# Patient Record
Sex: Female | Born: 1990 | Marital: Single | Smoking: Never smoker
Health system: Southern US, Community
[De-identification: ages and names within clinical notes are randomized; demographics above are authoritative.]

## PROBLEM LIST (undated history)

## (undated) DIAGNOSIS — L732 Hidradenitis suppurativa: Secondary | ICD-10-CM

## (undated) DIAGNOSIS — R51 Headache: Secondary | ICD-10-CM

## (undated) DIAGNOSIS — R519 Headache, unspecified: Secondary | ICD-10-CM

## (undated) HISTORY — DX: Hidradenitis suppurativa: L73.2

---

## 2013-03-05 ENCOUNTER — Ambulatory Visit (INDEPENDENT_AMBULATORY_CARE_PROVIDER_SITE_OTHER): Payer: BC Managed Care – PPO | Admitting: Emergency Medicine

## 2013-03-05 VITALS — BP 142/80 | HR 83 | Temp 98.5°F | Resp 16 | Ht 67.0 in | Wt 213.0 lb

## 2013-03-05 DIAGNOSIS — Z0000 Encounter for general adult medical examination without abnormal findings: Secondary | ICD-10-CM

## 2013-03-05 MED ORDER — VALACYCLOVIR HCL 500 MG PO TABS: 500.0000 mg | ORAL_TABLET | Freq: Every day | ORAL | Status: DC

## 2013-03-05 NOTE — Progress Notes (Signed)

## 2013-03-05 NOTE — Progress Notes (Signed)
[facility] [address] [place] [address] [phone]  Date:  03/05/2013   Name:  Dacey Milberger   DOB:  Dec 15, 1990   MRN:  [id number]  PCP:  No PCP Per Patient    Chief Complaint: Annual Exam   History of Present Illness:  Margarite Bjelland is a 22 y.o. very pleasant female patient who presents with the following:  Wellness examination   There are no active problems to display for this patient.   No past medical history on file.  No past surgical history on file.  History  Substance Use Topics  . Smoking status: Never Smoker   . Smokeless tobacco: Not on file  . Alcohol Use: 0.5 oz/week    1 drink(s) per week    No family history on file.  No Known Allergies  Medication list has been reviewed and updated.  No current outpatient prescriptions on file prior to visit.   No current facility-administered medications on file prior to visit.    Review of Systems:  As per HPI, otherwise negative.    Physical Examination: Filed Vitals:   03/05/13 1821  BP: 142/80  Pulse: 83  Temp: 98.5 F (36.9 C)  Resp: 16   Filed Vitals:   03/05/13 1821  Height: 5\' 7"  (1.702 m)  Weight: 213 lb (96.616 kg)   Body mass index is 33.35 kg/(m^2). Ideal Body Weight: Weight in (lb) to have BMI = 25: 159.3  GEN: WDWN, NAD, Non-toxic, A & O x 3 HEENT: Atraumatic, Normocephalic. Neck supple. No masses, No LAD. Ears and Nose: No external deformity. CV: RRR, No M/G/R. No JVD. No thrill. No extra heart sounds. PULM: CTA B, no wheezes, crackles, rhonchi. No retractions. No resp. distress. No accessory muscle use. ABD: S, NT, ND, +BS. No rebound. No HSM. EXTR: No c/c/e NEURO Normal gait.  PSYCH: Normally interactive. Conversant. Not depressed or anxious appearing.  Calm demeanor.    Assessment and Plan: Wellness exam   Signed,  Phillips Odor, MD

## 2014-03-05 ENCOUNTER — Ambulatory Visit (INDEPENDENT_AMBULATORY_CARE_PROVIDER_SITE_OTHER): Payer: BC Managed Care – PPO | Admitting: Internal Medicine

## 2014-03-05 ENCOUNTER — Ambulatory Visit (INDEPENDENT_AMBULATORY_CARE_PROVIDER_SITE_OTHER): Payer: BC Managed Care – PPO

## 2014-03-05 VITALS — BP 122/80 | HR 76 | Temp 98.1°F | Resp 16 | Ht 66.5 in | Wt 224.0 lb

## 2014-03-05 DIAGNOSIS — S99919A Unspecified injury of unspecified ankle, initial encounter: Secondary | ICD-10-CM

## 2014-03-05 DIAGNOSIS — S8990XA Unspecified injury of unspecified lower leg, initial encounter: Secondary | ICD-10-CM

## 2014-03-05 DIAGNOSIS — S99929A Unspecified injury of unspecified foot, initial encounter: Secondary | ICD-10-CM

## 2014-03-05 DIAGNOSIS — S93409A Sprain of unspecified ligament of unspecified ankle, initial encounter: Secondary | ICD-10-CM

## 2014-03-05 DIAGNOSIS — S99911A Unspecified injury of right ankle, initial encounter: Secondary | ICD-10-CM

## 2014-03-05 MED ORDER — HYDROCODONE-ACETAMINOPHEN 5-325 MG PO TABS: 1.0000 | ORAL_TABLET | Freq: Four times a day (QID) | ORAL | Status: DC | PRN

## 2014-03-05 NOTE — Progress Notes (Signed)
   Subjective:    Patient ID: Kari Castro, female    DOB: Oct 17, 1990, 23 y.o.   MRN: [id number]  HPI Patient is being seen for right ankle injury by playing football yesterday around 7 pm. She was running with the ball and heard a pop. Pain with movement. A little numbness at the right foot and across ankle. Use socks as compress. Have not taken any OTC.  Inverted and rolled over lateral ankle with pop at lateral malleolus Previous injury the same right ankle about 13-10 years ago.    Review of Systems     Objective:   Physical Exam  Constitutional: She is oriented to person, place, and time. She appears well-developed and well-nourished. No distress.  HENT:  Head: Normocephalic.  Eyes: EOM are normal.  Neck: Normal range of motion.  Pulmonary/Chest: Effort normal.  Musculoskeletal: She exhibits edema and tenderness.       Right ankle: She exhibits decreased range of motion, swelling and ecchymosis. She exhibits no deformity, no laceration and normal pulse. Tenderness. Lateral malleolus and proximal fibula tenderness found. No medial malleolus and no head of 5th metatarsal tenderness found.       Feet:  Swollen and tender  Neurological: She is alert and oriented to person, place, and time. She has normal strength. No cranial nerve deficit or sensory deficit. She exhibits normal muscle tone. Coordination and gait abnormal.  Psychiatric: She has a normal mood and affect. Her behavior is normal. Judgment and thought content normal.   [facility] reading (PRIMARY) by  Dr.Guest no fx seen, lucency lateral malleolus with no cortical irregularity.        Assessment & Plan:  Right ankle sprain RICE/Camwalker/RICE

## 2014-03-05 NOTE — Patient Instructions (Signed)
Acute Ankle Sprain with Phase I Rehab An acute ankle sprain is a partial or complete tear in one or more of the ligaments of the ankle due to traumatic injury. The severity of the injury depends on both the number of ligaments sprained and the grade of sprain. There are 3 grades of sprains.   A grade 1 sprain is a mild sprain. There is a slight pull without obvious tearing. There is no loss of strength, and the muscle and ligament are the correct length.  A grade 2 sprain is a moderate sprain. There is tearing of fibers within the substance of the ligament where it connects two bones or two cartilages. The length of the ligament is increased, and there is usually decreased strength.  A grade 3 sprain is a complete rupture of the ligament and is uncommon. In addition to the grade of sprain, there are three types of ankle sprains.  Lateral ankle sprains: This is a sprain of one or more of the three ligaments on the outer side (lateral) of the ankle. These are the most common sprains. Medial ankle sprains: There is one large triangular ligament of the inner side (medial) of the ankle that is susceptible to injury. Medial ankle sprains are less common. Syndesmosis, "high ankle," sprains: The syndesmosis is the ligament that connects the two bones of the lower leg. Syndesmosis sprains usually only occur with very severe ankle sprains. SYMPTOMS  Pain, tenderness, and swelling in the ankle, starting at the side of injury that may progress to the whole ankle and foot with time.  "Pop" or tearing sensation at the time of injury.  Bruising that may spread to the heel.  Impaired ability to walk soon after injury. CAUSES   Acute ankle sprains are caused by trauma placed on the ankle that temporarily forces or pries the anklebone (talus) out of its normal socket.  Stretching or tearing of the ligaments that normally hold the joint in place (usually due to a twisting injury). RISK INCREASES  WITH:  Previous ankle sprain.  Sports in which the foot may land awkwardly (i.e., basketball, volleyball, or soccer) or walking or running on uneven or rough surfaces.  Shoes with inadequate support to prevent sideways motion when stress occurs.  Poor strength and flexibility.  Poor balance skills.  Contact sports. PREVENTION   Warm up and stretch properly before activity.  Maintain physical fitness:  Ankle and leg flexibility, muscle strength, and endurance.  Cardiovascular fitness.  Balance training activities.  Use proper technique and have a coach correct improper technique.  Taping, protective strapping, bracing, or high-top tennis shoes may help prevent injury. Initially, tape is best; however, it loses most of its support function within 10 to 15 minutes.  Wear proper-fitted protective shoes (High-top shoes with taping or bracing is more effective than either alone).  Provide the ankle with support during sports and practice activities for 12 months following injury. PROGNOSIS   If treated properly, ankle sprains can be expected to recover completely; however, the length of recovery depends on the degree of injury.  A grade 1 sprain usually heals enough in 5 to 7 days to allow modified activity and requires an average of 6 weeks to heal completely.  A grade 2 sprain requires 6 to 10 weeks to heal completely.  A grade 3 sprain requires 12 to 16 weeks to heal.  A syndesmosis sprain often takes more than 3 months to heal. RELATED COMPLICATIONS   Frequent recurrence of symptoms may   result in a chronic problem. Appropriately addressing the problem the first time decreases the frequency of recurrence and optimizes healing time. Severity of the initial sprain does not predict the likelihood of later instability.  Injury to other structures (bone, cartilage, or tendon).  A chronically unstable or arthritic ankle joint is a possibility with repeated  sprains. TREATMENT Treatment initially involves the use of ice, medication, and compression bandages to help reduce pain and inflammation. Ankle sprains are usually immobilized in a walking cast or boot to allow for healing. Crutches may be recommended to reduce pressure on the injury. After immobilization, strengthening and stretching exercises may be necessary to regain strength and a full range of motion. Surgery is rarely needed to treat ankle sprains. MEDICATION   Nonsteroidal anti-inflammatory medications, such as aspirin and ibuprofen (do not take for the first 3 days after injury or within 7 days before surgery), or other minor pain relievers, such as acetaminophen, are often recommended. Take these as directed by your caregiver. Contact your caregiver immediately if any bleeding, stomach upset, or signs of an allergic reaction occur from these medications.  Ointments applied to the skin may be helpful.  Pain relievers may be prescribed as necessary by your caregiver. Do not take prescription pain medication for longer than 4 to 7 days. Use only as directed and only as much as you need. HEAT AND COLD  Cold treatment (icing) is used to relieve pain and reduce inflammation for acute and chronic cases. Cold should be applied for 10 to 15 minutes every 2 to 3 hours for inflammation and pain and immediately after any activity that aggravates your symptoms. Use ice packs or an ice massage.  Heat treatment may be used before performing stretching and strengthening activities prescribed by your caregiver. Use a heat pack or a warm soak. SEEK IMMEDIATE MEDICAL CARE IF:   Pain, swelling, or bruising worsens despite treatment.  You experience pain, numbness, discoloration, or coldness in the foot or toes.  New, unexplained symptoms develop (drugs used in treatment may produce side effects.) EXERCISES  PHASE I EXERCISES RANGE OF MOTION (ROM) AND STRETCHING EXERCISES - Ankle Sprain, Acute Phase I,  Weeks 1 to 2 These exercises may help you when beginning to restore flexibility in your ankle. You will likely work on these exercises for the 1 to 2 weeks after your injury. Once your physician, physical therapist, or athletic trainer sees adequate progress, he or she will advance your exercises. While completing these exercises, remember:   Restoring tissue flexibility helps normal motion to return to the joints. This allows healthier, less painful movement and activity.  An effective stretch should be held for at least 30 seconds.  A stretch should never be painful. You should only feel a gentle lengthening or release in the stretched tissue. RANGE OF MOTION - Dorsi/Plantar Flexion  While sitting with your right / left knee straight, draw the top of your foot upwards by flexing your ankle. Then reverse the motion, pointing your toes downward.  Hold each position for __________ seconds.  After completing your first set of exercises, repeat this exercise with your knee bent. Repeat __________ times. Complete this exercise __________ times per day.  RANGE OF MOTION - Ankle Alphabet  Imagine your right / left big toe is a pen.  Keeping your hip and knee still, write out the entire alphabet with your "pen." Make the letters as large as you can without increasing any discomfort. Repeat __________ times. Complete this exercise __________   times per day.  STRENGTHENING EXERCISES - Ankle Sprain, Acute -Phase I, Weeks 1 to 2 These exercises may help you when beginning to restore strength in your ankle. You will likely work on these exercises for 1 to 2 weeks after your injury. Once your physician, physical therapist, or athletic trainer sees adequate progress, he or she will advance your exercises. While completing these exercises, remember:   Muscles can gain both the endurance and the strength needed for everyday activities through controlled exercises.  Complete these exercises as instructed by  your physician, physical therapist, or athletic trainer. Progress the resistance and repetitions only as guided.  You may experience muscle soreness or fatigue, but the pain or discomfort you are trying to eliminate should never worsen during these exercises. If this pain does worsen, stop and make certain you are following the directions exactly. If the pain is still present after adjustments, discontinue the exercise until you can discuss the trouble with your clinician. STRENGTH - Dorsiflexors  Secure a rubber exercise band/tubing to a fixed object (i.e., table, pole) and loop the other end around your right / left foot.  Sit on the floor facing the fixed object. The band/tubing should be slightly tense when your foot is relaxed.  Slowly draw your foot back toward you using your ankle and toes.  Hold this position for __________ seconds. Slowly release the tension in the band and return your foot to the starting position. Repeat __________ times. Complete this exercise __________ times per day.  STRENGTH - Plantar-flexors   Sit with your right / left leg extended. Holding onto both ends of a rubber exercise band/tubing, loop it around the ball of your foot. Keep a slight tension in the band.  Slowly push your toes away from you, pointing them downward.  Hold this position for __________ seconds. Return slowly, controlling the tension in the band/tubing. Repeat __________ times. Complete this exercise __________ times per day.  STRENGTH - Ankle Eversion  Secure one end of a rubber exercise band/tubing to a fixed object (table, pole). Loop the other end around your foot just before your toes.  Place your fists between your knees. This will focus your strengthening at your ankle.  Drawing the band/tubing across your opposite foot, slowly, pull your little toe out and up. Make sure the band/tubing is positioned to resist the entire motion.  Hold this position for __________ seconds. Have  your muscles resist the band/tubing as it slowly pulls your foot back to the starting position.  Repeat __________ times. Complete this exercise __________ times per day.  STRENGTH - Ankle Inversion  Secure one end of a rubber exercise band/tubing to a fixed object (table, pole). Loop the other end around your foot just before your toes.  Place your fists between your knees. This will focus your strengthening at your ankle.  Slowly, pull your big toe up and in, making sure the band/tubing is positioned to resist the entire motion.  Hold this position for __________ seconds.  Have your muscles resist the band/tubing as it slowly pulls your foot back to the starting position. Repeat __________ times. Complete this exercises __________ times per day.  STRENGTH - Towel Curls  Sit in a chair positioned on a non-carpeted surface.  Place your right / left foot on a towel, keeping your heel on the floor.  Pull the towel toward your heel by only curling your toes. Keep your heel on the floor.  If instructed by your physician, physical therapist,   or athletic trainer, add weight to the end of the towel. Repeat __________ times. Complete this exercise __________ times per day. Document Released: 01/03/2005 Document Revised: 10/19/2013 Document Reviewed: 09/16/2008 [facility] Patient Information 2015 [place], [place]. This information is not intended to replace advice given to you by your health care provider. Make sure you discuss any questions you have with your health care provider. RICE: Routine Care for Injuries The routine care of many injuries includes Rest, Ice, Compression, and Elevation (RICE). HOME CARE INSTRUCTIONS  Rest is needed to allow your body to heal. Routine activities can usually be resumed when comfortable. Injured tendons and bones can take up to 6 weeks to heal. Tendons are the cord-like structures that attach muscle to bone.  Ice following an injury helps keep the swelling  down and reduces pain.  Put ice in a plastic bag.  Place a towel between your skin and the bag.  Leave the ice on for 15-20 minutes, 3-4 times a day, or as directed by your health care provider. Do this while awake, for the first 24 to 48 hours. After that, continue as directed by your caregiver.  Compression helps keep swelling down. It also gives support and helps with discomfort. If an elastic bandage has been applied, it should be removed and reapplied every 3 to 4 hours. It should not be applied tightly, but firmly enough to keep swelling down. Watch fingers or toes for swelling, bluish discoloration, coldness, numbness, or excessive pain. If any of these problems occur, remove the bandage and reapply loosely. Contact your caregiver if these problems continue.  Elevation helps reduce swelling and decreases pain. With extremities, such as the arms, hands, legs, and feet, the injured area should be placed near or above the level of the heart, if possible. SEEK IMMEDIATE MEDICAL CARE IF:  You have persistent pain and swelling.  You develop redness, numbness, or unexpected weakness.  Your symptoms are getting worse rather than improving after several days. These symptoms may indicate that further evaluation or further X-rays are needed. Sometimes, X-rays may not show a small broken bone (fracture) until 1 week or 10 days later. Make a follow-up appointment with your caregiver. Ask when your X-ray results will be ready. Make sure you get your X-ray results. Document Released: 09/16/2000 Document Revised: 06/09/2013 Document Reviewed: 11/03/2010 [facility] Patient Information 2015 [place], [place]. This information is not intended to replace advice given to you by your health care provider. Make sure you discuss any questions you have with your health care provider.

## 2014-03-05 NOTE — Progress Notes (Signed)
   Subjective:    Patient ID: Kari Castro, female    DOB: [birth year], 23 y.o.   MRN: [id number]  HPI    Review of Systems     Objective:   Physical Exam        Assessment & Plan:

## 2014-03-19 ENCOUNTER — Ambulatory Visit (INDEPENDENT_AMBULATORY_CARE_PROVIDER_SITE_OTHER): Payer: BC Managed Care – PPO

## 2014-03-19 ENCOUNTER — Ambulatory Visit (INDEPENDENT_AMBULATORY_CARE_PROVIDER_SITE_OTHER): Payer: BC Managed Care – PPO | Admitting: Family Medicine

## 2014-03-19 VITALS — BP 136/82 | HR 76 | Temp 97.9°F | Resp 16 | Ht 67.0 in | Wt 213.0 lb

## 2014-03-19 DIAGNOSIS — S93401D Sprain of unspecified ligament of right ankle, subsequent encounter: Secondary | ICD-10-CM

## 2014-03-19 NOTE — Patient Instructions (Addendum)
Please wear the ankle brace while you are at your work.  If it becomes unbearable, you can put back on the camwalker boot.  If that is unbearable, please return before the two weeks.   Ibuprofen for discomfort and pain.   Return to the clinic for follow up in 2 weeks.   Listed, are exercises that you can do to strengthen the ankle and ligaments.     Acute Ankle Sprain with Phase I Rehab An acute ankle sprain is a partial or complete tear in one or more of the ligaments of the ankle due to traumatic injury. The severity of the injury depends on both the number of ligaments sprained and the grade of sprain. There are 3 grades of sprains.   A grade 1 sprain is a mild sprain. There is a slight pull without obvious tearing. There is no loss of strength, and the muscle and ligament are the correct length.  A grade 2 sprain is a moderate sprain. There is tearing of fibers within the substance of the ligament where it connects two bones or two cartilages. The length of the ligament is increased, and there is usually decreased strength.  A grade 3 sprain is a complete rupture of the ligament and is uncommon. In addition to the grade of sprain, there are three types of ankle sprains.  Lateral ankle sprains: This is a sprain of one or more of the three ligaments on the outer side (lateral) of the ankle. These are the most common sprains. Medial ankle sprains: There is one large triangular ligament of the inner side (medial) of the ankle that is susceptible to injury. Medial ankle sprains are less common. Syndesmosis, "high ankle," sprains: The syndesmosis is the ligament that connects the two bones of the lower leg. Syndesmosis sprains usually only occur with very severe ankle sprains. SYMPTOMS  Pain, tenderness, and swelling in the ankle, starting at the side of injury that may progress to the whole ankle and foot with time.  "Pop" or tearing sensation at the time of injury.  Bruising that may spread  to the heel.  Impaired ability to walk soon after injury. CAUSES   Acute ankle sprains are caused by trauma placed on the ankle that temporarily forces or pries the anklebone (talus) out of its normal socket.  Stretching or tearing of the ligaments that normally hold the joint in place (usually due to a twisting injury). RISK INCREASES WITH:  Previous ankle sprain.  Sports in which the foot may land awkwardly (i.e., basketball, volleyball, or soccer) or walking or running on uneven or rough surfaces.  Shoes with inadequate support to prevent sideways motion when stress occurs.  Poor strength and flexibility.  Poor balance skills.  Contact sports. PREVENTION   Warm up and stretch properly before activity.  Maintain physical fitness:  Ankle and leg flexibility, muscle strength, and endurance.  Cardiovascular fitness.  Balance training activities.  Use proper technique and have a coach correct improper technique.  Taping, protective strapping, bracing, or high-top tennis shoes may help prevent injury. Initially, tape is best; however, it loses most of its support function within 10 to 15 minutes.  Wear proper-fitted protective shoes (High-top shoes with taping or bracing is more effective than either alone).  Provide the ankle with support during sports and practice activities for 12 months following injury. PROGNOSIS   If treated properly, ankle sprains can be expected to recover completely; however, the length of recovery depends on the degree of injury.  A grade 1 sprain usually heals enough in 5 to 7 days to allow modified activity and requires an average of 6 weeks to heal completely.  A grade 2 sprain requires 6 to 10 weeks to heal completely.  A grade 3 sprain requires 12 to 16 weeks to heal.  A syndesmosis sprain often takes more than 3 months to heal. RELATED COMPLICATIONS   Frequent recurrence of symptoms may result in a chronic problem. Appropriately  addressing the problem the first time decreases the frequency of recurrence and optimizes healing time. Severity of the initial sprain does not predict the likelihood of later instability.  Injury to other structures (bone, cartilage, or tendon).  A chronically unstable or arthritic ankle joint is a possibility with repeated sprains. TREATMENT Treatment initially involves the use of ice, medication, and compression bandages to help reduce pain and inflammation. Ankle sprains are usually immobilized in a walking cast or boot to allow for healing. Crutches may be recommended to reduce pressure on the injury. After immobilization, strengthening and stretching exercises may be necessary to regain strength and a full range of motion. Surgery is rarely needed to treat ankle sprains. MEDICATION   Nonsteroidal anti-inflammatory medications, such as aspirin and ibuprofen (do not take for the first 3 days after injury or within 7 days before surgery), or other minor pain relievers, such as acetaminophen, are often recommended. Take these as directed by your caregiver. Contact your caregiver immediately if any bleeding, stomach upset, or signs of an allergic reaction occur from these medications.  Ointments applied to the skin may be helpful.  Pain relievers may be prescribed as necessary by your caregiver. Do not take prescription pain medication for longer than 4 to 7 days. Use only as directed and only as much as you need. HEAT AND COLD  Cold treatment (icing) is used to relieve pain and reduce inflammation for acute and chronic cases. Cold should be applied for 10 to 15 minutes every 2 to 3 hours for inflammation and pain and immediately after any activity that aggravates your symptoms. Use ice packs or an ice massage.  Heat treatment may be used before performing stretching and strengthening activities prescribed by your caregiver. Use a heat pack or a warm soak. SEEK IMMEDIATE MEDICAL CARE IF:    Pain, swelling, or bruising worsens despite treatment.  You experience pain, numbness, discoloration, or coldness in the foot or toes.  New, unexplained symptoms develop (drugs used in treatment may produce side effects.) EXERCISES  PHASE I EXERCISES RANGE OF MOTION (ROM) AND STRETCHING EXERCISES - Ankle Sprain, Acute Phase I, Weeks 1 to 2 These exercises may help you when beginning to restore flexibility in your ankle. You will likely work on these exercises for the 1 to 2 weeks after your injury. Once your physician, physical therapist, or athletic trainer sees adequate progress, he or she will advance your exercises. While completing these exercises, remember:   Restoring tissue flexibility helps normal motion to return to the joints. This allows healthier, less painful movement and activity.  An effective stretch should be held for at least 30 seconds.  A stretch should never be painful. You should only feel a gentle lengthening or release in the stretched tissue. RANGE OF MOTION - Dorsi/Plantar Flexion  While sitting with your right / left knee straight, draw the top of your foot upwards by flexing your ankle. Then reverse the motion, pointing your toes downward.  Hold each position for __________ seconds.  After completing your first  set of exercises, repeat this exercise with your knee bent. Repeat __________ times. Complete this exercise __________ times per day.  RANGE OF MOTION - Ankle Alphabet  Imagine your right / left big toe is a pen.  Keeping your hip and knee still, write out the entire alphabet with your "pen." Make the letters as large as you can without increasing any discomfort. Repeat __________ times. Complete this exercise __________ times per day.  STRENGTHENING EXERCISES - Ankle Sprain, Acute -Phase I, Weeks 1 to 2 These exercises may help you when beginning to restore strength in your ankle. You will likely work on these exercises for 1 to 2 weeks after  your injury. Once your physician, physical therapist, or athletic trainer sees adequate progress, he or she will advance your exercises. While completing these exercises, remember:   Muscles can gain both the endurance and the strength needed for everyday activities through controlled exercises.  Complete these exercises as instructed by your physician, physical therapist, or athletic trainer. Progress the resistance and repetitions only as guided.  You may experience muscle soreness or fatigue, but the pain or discomfort you are trying to eliminate should never worsen during these exercises. If this pain does worsen, stop and make certain you are following the directions exactly. If the pain is still present after adjustments, discontinue the exercise until you can discuss the trouble with your clinician. STRENGTH - Dorsiflexors  Secure a rubber exercise band/tubing to a fixed object (i.e., table, pole) and loop the other end around your right / left foot.  Sit on the floor facing the fixed object. The band/tubing should be slightly tense when your foot is relaxed.  Slowly draw your foot back toward you using your ankle and toes.  Hold this position for __________ seconds. Slowly release the tension in the band and return your foot to the starting position. Repeat __________ times. Complete this exercise __________ times per day.  STRENGTH - Plantar-flexors   Sit with your right / left leg extended. Holding onto both ends of a rubber exercise band/tubing, loop it around the ball of your foot. Keep a slight tension in the band.  Slowly push your toes away from you, pointing them downward.  Hold this position for __________ seconds. Return slowly, controlling the tension in the band/tubing. Repeat __________ times. Complete this exercise __________ times per day.  STRENGTH - Ankle Eversion  Secure one end of a rubber exercise band/tubing to a fixed object (table, pole). Loop the other end  around your foot just before your toes.  Place your fists between your knees. This will focus your strengthening at your ankle.  Drawing the band/tubing across your opposite foot, slowly, pull your little toe out and up. Make sure the band/tubing is positioned to resist the entire motion.  Hold this position for __________ seconds. Have your muscles resist the band/tubing as it slowly pulls your foot back to the starting position.  Repeat __________ times. Complete this exercise __________ times per day.  STRENGTH - Ankle Inversion  Secure one end of a rubber exercise band/tubing to a fixed object (table, pole). Loop the other end around your foot just before your toes.  Place your fists between your knees. This will focus your strengthening at your ankle.  Slowly, pull your big toe up and in, making sure the band/tubing is positioned to resist the entire motion.  Hold this position for __________ seconds.  Have your muscles resist the band/tubing as it slowly pulls your foot  back to the starting position. Repeat __________ times. Complete this exercises __________ times per day.  STRENGTH - Towel Curls  Sit in a chair positioned on a non-carpeted surface.  Place your right / left foot on a towel, keeping your heel on the floor.  Pull the towel toward your heel by only curling your toes. Keep your heel on the floor.  If instructed by your physician, physical therapist, or athletic trainer, add weight to the end of the towel. Repeat __________ times. Complete this exercise __________ times per day. Document Released: 01/03/2005 Document Revised: 10/19/2013 Document Reviewed: 09/16/2008 [facility] Patient Information 2015 [place]. This information is not intended to replace advice given to you by your health care provider. Make sure you discuss any questions you have with your health care provider.

## 2014-03-19 NOTE — Progress Notes (Signed)
   Subjective:    Patient ID: Doreene ElandHilliary Salado, female    DOB: 04/23/1991, 23 y.o.   MRN: [id number]  HPI This is a 23 year old female here today for follow up of a right ankle sprain.  2 weeks ago she [facility] after having a popping sensation when she rolled her ankle while playing football.  AnkleXR was unremarkable at that time. Today, she reports that the pain is much better.  She continues to wear the camwalker but is unable to put full weight on her right foot.  She ices the ankle every day, but no NSAID or anti-inflammatory med use.  She works as a Engineer, siteschool teacher and it has been very difficult for her to get off the ankle.      Review of Systems  Musculoskeletal: Positive for joint swelling.  Skin: Negative for color change.       Objective:   Physical Exam  Musculoskeletal:       Right ankle: She exhibits swelling. Tenderness. Lateral malleolus tenderness found.       Feet:  Foot Xray Interpretation by Dr. Neva SeatGreene: Negative      Assessment & Plan:  Sprain of ankle, right, subsequent encounter - Plan: DG Ankle Complete Right The foot xray still does not demonstrate fractures or dislocations.   Patient given a brace with instruction of therapeutic exercises to strengthen her ankle. RTC in 2 weeks.  Trena PlattStephanie English, PA-C [facility] 10/2/20155:57 PM

## 2014-03-20 NOTE — Progress Notes (Signed)
I have examined this patient along with Ms. English, PA-C and agree.  

## 2014-10-28 ENCOUNTER — Other Ambulatory Visit: Payer: Self-pay | Admitting: Emergency Medicine

## 2015-03-05 ENCOUNTER — Ambulatory Visit (INDEPENDENT_AMBULATORY_CARE_PROVIDER_SITE_OTHER): Payer: BC Managed Care – PPO | Admitting: Physician Assistant

## 2015-03-05 VITALS — BP 120/70 | HR 73 | Temp 99.0°F | Resp 14 | Ht 67.5 in | Wt 221.0 lb

## 2015-03-05 DIAGNOSIS — R599 Enlarged lymph nodes, unspecified: Secondary | ICD-10-CM

## 2015-03-05 DIAGNOSIS — R591 Generalized enlarged lymph nodes: Secondary | ICD-10-CM | POA: Diagnosis not present

## 2015-03-05 DIAGNOSIS — H9202 Otalgia, left ear: Secondary | ICD-10-CM

## 2015-03-05 DIAGNOSIS — M62838 Other muscle spasm: Secondary | ICD-10-CM

## 2015-03-05 DIAGNOSIS — M6248 Contracture of muscle, other site: Secondary | ICD-10-CM | POA: Diagnosis not present

## 2015-03-05 MED ORDER — CYCLOBENZAPRINE HCL 5 MG PO TABS: 5.0000 mg | ORAL_TABLET | Freq: Three times a day (TID) | ORAL | Status: DC | PRN

## 2015-03-05 MED ORDER — MELOXICAM 7.5 MG PO TABS: 7.5000 mg | ORAL_TABLET | Freq: Every day | ORAL | Status: DC

## 2015-03-05 NOTE — Patient Instructions (Signed)
Please take the flexeril up to three times daily for the muscle spasm and mobic once daily.  Applying heat to the area will help along with massage. Your swollen lymph nodes are not concerning at this time but please come back to see [place] if they're not decreasing in size in 10-14 days.   Swollen Lymph Nodes The lymphatic system filters fluid from around cells. It is like a system of blood vessels. These channels carry lymph instead of blood. The lymphatic system is an important part of the immune (disease fighting) system. When people talk about "swollen glands in the neck," they are usually talking about swollen lymph nodes. The lymph nodes are like the little traps for infection. You and your caregiver may be able to feel lymph nodes, especially swollen nodes, in these common areas: the groin (inguinal area), armpits (axilla), and above the clavicle (supraclavicular). You may also feel them in the neck (cervical) and the back of the head just above the hairline (occipital). Swollen glands occur when there is any condition in which the body responds with an allergic type of reaction. For instance, the glands in the neck can become swollen from insect bites or any type of minor infection on the head. These are very noticeable in children with only minor problems. Lymph nodes may also become swollen when there is a tumor or problem with the lymphatic system, such as Hodgkin's disease. TREATMENT   Most swollen glands do not require treatment. They can be observed (watched) for a short period of time, if your caregiver feels it is necessary. Most of the time, observation is not necessary.  Antibiotics (medicines that kill germs) may be prescribed by your caregiver. Your caregiver may prescribe these if he or she feels the swollen glands are due to a bacterial (germ) infection. Antibiotics are not used if the swollen glands are caused by a virus. HOME CARE INSTRUCTIONS   Take medications as directed by your  caregiver. Only take over-the-counter or prescription medicines for pain, discomfort, or fever as directed by your caregiver. SEEK MEDICAL CARE IF:   If you begin to run a temperature greater than 102 F (38.9 C), or as your caregiver suggests. MAKE SURE YOU:   Understand these instructions.  Will watch your condition.  Will get help right away if you are not doing well or get worse. Document Released: 05/25/2002 Document Revised: 08/27/2011 Document Reviewed: 06/04/2005 [facility] Patient Information 2015 [place], [place]. This information is not intended to replace advice given to you by your health care provider. Make sure you discuss any questions you have with your health care provider.   Muscle Cramps and Spasms Muscle cramps and spasms occur when a muscle or muscles tighten and you have no control over this tightening (involuntary muscle contraction). They are a common problem and can develop in any muscle. The most common place is in the calf muscles of the leg. Both muscle cramps and muscle spasms are involuntary muscle contractions, but they also have differences:   Muscle cramps are sporadic and painful. They may last a few seconds to a quarter of an hour. Muscle cramps are often more forceful and last longer than muscle spasms.  Muscle spasms may or may not be painful. They may also last just a few seconds or much longer. CAUSES  It is uncommon for cramps or spasms to be due to a serious underlying problem. In many cases, the cause of cramps or spasms is unknown. Some common causes are:  Overexertion.   Overuse from repetitive motions (doing the same thing over and over).   Remaining in a certain position for a long period of time.   Improper preparation, form, or technique while performing a sport or activity.   Dehydration.   Injury.   Side effects of some medicines.   Abnormally low levels of the salts and ions in your blood (electrolytes), especially  potassium and calcium. This could happen if you are taking water pills (diuretics) or you are pregnant.  Some underlying medical problems can make it more likely to develop cramps or spasms. These include, but are not limited to:   Diabetes.   Parkinson disease.   Hormone disorders, such as thyroid problems.   Alcohol abuse.   Diseases specific to muscles, joints, and bones.   Blood vessel disease where not enough blood is getting to the muscles.  HOME CARE INSTRUCTIONS   Stay well hydrated. Drink enough water and fluids to keep your urine clear or pale yellow.  It may be helpful to massage, stretch, and relax the affected muscle.  For tight or tense muscles, use a warm towel, heating pad, or hot shower water directed to the affected area.  If you are sore or have pain after a cramp or spasm, applying ice to the affected area may relieve discomfort.  Put ice in a plastic bag.  Place a towel between your skin and the bag.  Leave the ice on for 15-20 minutes, 03-04 times a day.  Medicines used to treat a known cause of cramps or spasms may help reduce their frequency or severity. Only take over-the-counter or prescription medicines as directed by your caregiver. SEEK MEDICAL CARE IF:  Your cramps or spasms get more severe, more frequent, or do not improve over time.  MAKE SURE YOU:   Understand these instructions.  Will watch your condition.  Will get help right away if you are not doing well or get worse. Document Released: 11/24/2001 Document Revised: 09/29/2012 Document Reviewed: 05/21/2012 [facility] Patient Information 2015 [place], [place]. This information is not intended to replace advice given to you by your health care provider. Make sure you discuss any questions you have with your health care provider.

## 2015-03-05 NOTE — Progress Notes (Signed)
   Subjective:    Patient ID: Kari Castro, female    DOB: 1991/02/26, 24 y.o.   MRN: [id number]  Chief Complaint  Patient presents with  . Neck Pain    c/o left sided neck pain x's 3 mos  . Ear Pain    Left ear pain x's 3 mos   Medications, allergies, past medical history, surgical history, family history, social history and problem list reviewed and updated.  HPI  24 yof presents with above sx.  Sx started 3 months ago with left sided neck pain. Especially with turning head to left. No trauma. Resolved after one month. Approx 1-2 weeks ago pain returned. Again no trauma. Also having mild right sided neck pain. Mostly when turns head.   Also mentions left ear hurting past 1-2 weeks. Feels like in ear. Denies discharge. Denies fevers or hearing loss. No tinnitus.   Review of Systems See HPI. No cough, st, right otalgia.     Objective:   Physical Exam  Constitutional: She appears well-developed and well-nourished.  Non-toxic appearance. She does not have a sickly appearance. She does not appear ill. No distress.  BP 120/70 mmHg  Pulse 73  Temp(Src) 99 F (37.2 C) (Oral)  Resp 14  Ht 5' 7.5" (1.715 m)  Wt 221 lb (100.245 kg)  BMI 34.08 kg/m2  SpO2 97%  LMP 02/27/2015   HENT:  Right Ear: Tympanic membrane normal. No drainage or tenderness.  Left Ear: Tympanic membrane normal. No drainage or tenderness.  Nose: No mucosal edema or rhinorrhea. Right sinus exhibits no maxillary sinus tenderness and no frontal sinus tenderness. Left sinus exhibits no maxillary sinus tenderness and no frontal sinus tenderness.  Mouth/Throat: Uvula is midline, oropharynx is clear and moist and mucous membranes are normal.  No mastoid tenderness bilaterally.   Neck: Normal range of motion. Muscular tenderness present. No spinous process tenderness present. No Brudzinski's sign noted.    Bilateral cervical muscle spasms.   Lymphadenopathy:       Head (right side): Submandibular and tonsillar  adenopathy present. No submental adenopathy present.       Head (left side): No submental, no submandibular and no tonsillar adenopathy present.    She has no cervical adenopathy.      Assessment & Plan:   Muscle spasms of neck - Plan: cyclobenzaprine (FLEXERIL) 5 MG tablet, meloxicam (MOBIC) 7.5 MG tablet Otalgia, left Swollen lymph nodes --spasms along neck likely causing pain -- flexeril, mobic, heat, massage, light range of motion --normal vitals, neg brudzinskis, left ear exam normal, no mastoid ttp --right submandibular/tonsillar LAN - likely benign process as exam benign today, normal vitals, instructed to rtc 10-14 days if LNs not decreasing in size, pt agreeable  Donnajean Lopes, PA-C Physician Assistant-Certified [facility]  03/05/2015 9:38 AM

## 2015-03-08 ENCOUNTER — Telehealth: Payer: Self-pay

## 2015-03-08 NOTE — Telephone Encounter (Signed)
The muscle relaxer is not knocking out the pain.  Does she need to come back in?  [phone]

## 2015-03-09 ENCOUNTER — Ambulatory Visit (INDEPENDENT_AMBULATORY_CARE_PROVIDER_SITE_OTHER): Payer: BC Managed Care – PPO | Admitting: Emergency Medicine

## 2015-03-09 VITALS — BP 122/72 | HR 87 | Temp 98.4°F | Resp 17 | Ht 67.5 in | Wt 222.0 lb

## 2015-03-09 DIAGNOSIS — S161XXD Strain of muscle, fascia and tendon at neck level, subsequent encounter: Secondary | ICD-10-CM | POA: Diagnosis not present

## 2015-03-09 MED ORDER — NAPROXEN SODIUM 550 MG PO TABS: 550.0000 mg | ORAL_TABLET | Freq: Two times a day (BID) | ORAL | Status: DC

## 2015-03-09 MED ORDER — HYDROCODONE-ACETAMINOPHEN 5-325 MG PO TABS: 1.0000 | ORAL_TABLET | ORAL | Status: DC | PRN

## 2015-03-09 NOTE — Patient Instructions (Signed)

## 2015-03-09 NOTE — Telephone Encounter (Signed)
Yes she needs to be rechecked.

## 2015-03-09 NOTE — Progress Notes (Signed)
Subjective:  Patient ID: Kari Castro, female    DOB: Nov 22, 1990  Age: 24 y.o. MRN: [id number]  CC: Neck Pain and Follow-up   HPI Kari Castro presents  for follow-up of a neck injury. She's been treated with mobic and Flexeril with no improvement. She has no discrete injury or overuse. She has no radiation of pain numbness tingling or weakness. She was last seen over the weekend. The pain is worse when she is physically active and goes away when she lays down flat.  History Kari Castro has no past medical history on file.   She has no past surgical history on file.   Her  family history includes Diabetes in her mother; Hypertension in her mother.  She   reports that she has never smoked. She does not have any smokeless tobacco history on file. She reports that she drinks about 0.5 oz of alcohol per week. She reports that she does not use illicit drugs.  Outpatient Prescriptions Prior to Visit  Medication Sig Dispense Refill  . cyclobenzaprine (FLEXERIL) 5 MG tablet Take 1 tablet (5 mg total) by mouth 3 (three) times daily as needed for muscle spasms. 30 tablet 1  . valACYclovir (VALTREX) 500 MG tablet take 1 tablet by mouth once daily 30 tablet 5  . meloxicam (MOBIC) 7.5 MG tablet Take 1 tablet (7.5 mg total) by mouth daily. 30 tablet 0   No facility-administered medications prior to visit.    Social History   Social History  . Marital Status: Single    Spouse Name: N/A  . Number of Children: N/A  . Years of Education: N/A   Social History Main Topics  . Smoking status: Never Smoker   . Smokeless tobacco: None  . Alcohol Use: 0.5 oz/week    1 drink(s) per week  . Drug Use: No  . Sexual Activity: Not Asked   Other Topics Concern  . None   Social History Narrative     Review of Systems  Constitutional: Negative for fever, chills and appetite change.  HENT: Negative for congestion, ear pain, postnasal drip, sinus pressure and sore throat.   Eyes: Negative  for pain and redness.  Respiratory: Negative for cough, shortness of breath and wheezing.   Cardiovascular: Negative for leg swelling.  Gastrointestinal: Negative for nausea, vomiting, abdominal pain, diarrhea, constipation and blood in stool.  Endocrine: Negative for polyuria.  Genitourinary: Negative for dysuria, urgency, frequency and flank pain.  Musculoskeletal: Negative for gait problem.  Skin: Negative for rash.  Neurological: Negative for weakness and headaches.  Psychiatric/Behavioral: Negative for confusion and decreased concentration. The patient is not nervous/anxious.     Objective:  BP 122/72 mmHg  Pulse 87  Temp(Src) 98.4 F (36.9 C) (Oral)  Resp 17  Ht 5' 7.5" (1.715 m)  Wt 222 lb (100.699 kg)  BMI 34.24 kg/m2  SpO2 97%  LMP 03/04/2015  Physical Exam  Constitutional: She is oriented to person, place, and time. She appears well-developed and well-nourished.  HENT:  Head: Normocephalic and atraumatic.  Eyes: Conjunctivae are normal. Pupils are equal, round, and reactive to light.  Pulmonary/Chest: Effort normal.  Musculoskeletal: She exhibits no edema.       Cervical back: She exhibits tenderness.  Neurological: She is alert and oriented to person, place, and time.  Skin: Skin is dry.  Psychiatric: She has a normal mood and affect. Her behavior is normal. Thought content normal.      Assessment & Plan:   Kari Castro was seen today  for neck pain and follow-up.  Diagnoses and all orders for this visit:  Cervical strain, subsequent encounter  Other orders -     naproxen sodium (ANAPROX DS) 550 MG tablet; Take 1 tablet (550 mg total) by mouth 2 (two) times daily with a meal. -     HYDROcodone-acetaminophen (NORCO) 5-325 MG per tablet; Take 1-2 tablets by mouth every 4 (four) hours as needed.   I have discontinued Ms. Madkins's meloxicam. I am also having her start on naproxen sodium and HYDROcodone-acetaminophen. Additionally, I am having her maintain her  valACYclovir and cyclobenzaprine.  Meds ordered this encounter  Medications  . naproxen sodium (ANAPROX DS) 550 MG tablet    Sig: Take 1 tablet (550 mg total) by mouth 2 (two) times daily with a meal.    Dispense:  40 tablet    Refill:  0  . HYDROcodone-acetaminophen (NORCO) 5-325 MG per tablet    Sig: Take 1-2 tablets by mouth every 4 (four) hours as needed.    Dispense:  30 tablet    Refill:  0    Appropriate red flag conditions were discussed with the patient as well as actions that should be taken.  Patient expressed his understanding.  Follow-up: Return if symptoms worsen or fail to improve.  Carmelina Dane, MD

## 2015-03-09 NOTE — Telephone Encounter (Signed)
Spoke with pt, advised to RTC. Pt agreed. 

## 2015-03-12 ENCOUNTER — Encounter (HOSPITAL_COMMUNITY): Payer: Self-pay

## 2015-03-12 ENCOUNTER — Emergency Department (HOSPITAL_COMMUNITY)
Admission: EM | Admit: 2015-03-12 | Discharge: 2015-03-12 | Disposition: A | Discharge status: Home / Self Care | Payer: BC Managed Care – PPO | Attending: Emergency Medicine | Admitting: Emergency Medicine

## 2015-03-12 DIAGNOSIS — Z79899 Other long term (current) drug therapy: Secondary | ICD-10-CM | POA: Insufficient documentation

## 2015-03-12 DIAGNOSIS — Z791 Long term (current) use of non-steroidal anti-inflammatories (NSAID): Secondary | ICD-10-CM | POA: Insufficient documentation

## 2015-03-12 DIAGNOSIS — R42 Dizziness and giddiness: Secondary | ICD-10-CM | POA: Insufficient documentation

## 2015-03-12 DIAGNOSIS — M791 Myalgia, unspecified site: Secondary | ICD-10-CM

## 2015-03-12 DIAGNOSIS — M542 Cervicalgia: Secondary | ICD-10-CM

## 2015-03-12 DIAGNOSIS — H9201 Otalgia, right ear: Secondary | ICD-10-CM | POA: Diagnosis present

## 2015-03-12 NOTE — ED Notes (Signed)
Pt reports right ear pain x 2 weeks with pain radiating down right side of neck, pain had been worse with eye movement to the left but now pain is constant no matter what/ She went to [facility] last Saturday and then again Saturday and was given Meloxicam and flexeril, naproxen and hydrocodone but has not taken the naproxen.

## 2015-03-12 NOTE — ED Provider Notes (Signed)
CSN: [id number]     Arrival date & time 03/12/15  1601 History  This chart was scribed for non-physician practitioner, Roxy Horseman, PA-C working with Mancel Bale, MD, by Jarvis Morgan, ED Scribe. This patient was seen in room TR06C/TR06C and the patient's care was started at 4:46 PM.     Chief Complaint  Patient presents with  . Otalgia    The history is provided by the patient. No language interpreter was used.    Kari Castro is a 24 y.o. female with no PMHx who presents to the Emergency Department with a chief complaint of intermittent, moderate, sharp, right otalgia onset several months hat has been gradually worsening and become constant over the past 2 weeks. She states the pain radiates down into the right side of her neck. Pt endorses the pain is worse with body movement and rotating of her head. She states rest makes the pain better. Pt reports she went to [facility] 1 week ago and was given rx for Meloxicam, Flexeril, naproxen and hydrocodone with mild relief. She has been complaint with the medication except she notes she has not taken the naproxen. She endorses she was dx with a muscle spasm or pinched nerve in her neck. She denies any injury to her neck or ear. She denies any fever, chills, or dental pain.  History reviewed. No pertinent past medical history. History reviewed. No pertinent past surgical history. Family History  Problem Relation Age of Onset  . Diabetes Mother   . Hypertension Mother    Social History  Substance Use Topics  . Smoking status: Never Smoker   . Smokeless tobacco: None  . Alcohol Use: 0.5 oz/week    1 drink(s) per week   OB History    No data available     Review of Systems  Constitutional: Negative for fever and chills.  HENT: Positive for ear pain (R). Negative for dental problem.   Musculoskeletal: Positive for neck pain.  Neurological: Positive for light-headedness.      Allergies  Review of patient's allergies indicates no  known allergies.  Home Medications   Prior to Admission medications   Medication Sig Start Date End Date Taking? Authorizing Provider  cyclobenzaprine (FLEXERIL) 5 MG tablet Take 1 tablet (5 mg total) by mouth 3 (three) times daily as needed for muscle spasms. 03/05/15   Raelyn Ensign, PA  HYDROcodone-acetaminophen (NORCO) 5-325 MG per tablet Take 1-2 tablets by mouth every 4 (four) hours as needed. 03/09/15   Carmelina Dane, MD  naproxen sodium (ANAPROX DS) 550 MG tablet Take 1 tablet (550 mg total) by mouth 2 (two) times daily with a meal. 03/09/15 03/08/16  Carmelina Dane, MD  valACYclovir (VALTREX) 500 MG tablet take 1 tablet by mouth once daily 11/01/14   Carmelina Dane, MD   Triage Vitals: BP 128/82 mmHg  Pulse 81  Temp(Src) 98.4 F (36.9 C) (Oral)  Resp 16  Ht 5' 7.5" (1.715 m)  Wt 221 lb (100.245 kg)  BMI 34.08 kg/m2  SpO2 99%  LMP 03/04/2015  Physical Exam  Constitutional: She is oriented to person, place, and time. She appears well-developed and well-nourished. No distress.  HENT:  Head: Normocephalic and atraumatic.  Right Ear: Tympanic membrane normal.  Normal dentition, no evidence of abscess  Eyes: Conjunctivae and EOM are normal.  Neck: Neck supple. No tracheal deviation present.  Cardiovascular: Normal rate.   Pulmonary/Chest: Effort normal. No respiratory distress.  Musculoskeletal: Normal range of motion.  Right sided SCM  tender to palpation. ROM limited secondary to pain.   Neurological: She is alert and oriented to person, place, and time.  Skin: Skin is warm and dry.  No evidence of rash, erythema or abscess  Psychiatric: She has a normal mood and affect. Her behavior is normal.  Nursing note and vitals reviewed.   ED Course  Procedures (including critical care time)  DIAGNOSTIC STUDIES: Oxygen Saturation is 99% on RA, normal by my interpretation.    COORDINATION OF CARE:      MDM   Final diagnoses:  Myalgia  Neck pain    Patient  with musculoskeletal right-sided neck pain. Tenderness of the SCM. I suspect that she likely has muscle spasm versus torticollis. Recommend orthopedic/physical therapy follow-up. Continue conservative therapy.   I personally performed the services described in this documentation, which was scribed in my presence. The recorded information has been reviewed and is accurate.     Roxy Horseman, PA-C 03/12/15 1803  Mancel Bale, MD 03/13/15 [phone]

## 2015-03-12 NOTE — Discharge Instructions (Signed)

## 2015-06-08 ENCOUNTER — Other Ambulatory Visit: Payer: Self-pay | Admitting: Emergency Medicine

## 2015-06-14 IMAGING — CR DG ANKLE COMPLETE 3+V*R*
4 series · 4 of 4 positions shown · non-contrast
Comparison: Plain films right ankle 03/05/2014.

CLINICAL DATA: Status post right ankle sprain 2 weeks ago.
Continued lateral right ankle pain.

EXAM:
RIGHT ANKLE - COMPLETE 3+ VIEW

[AP]
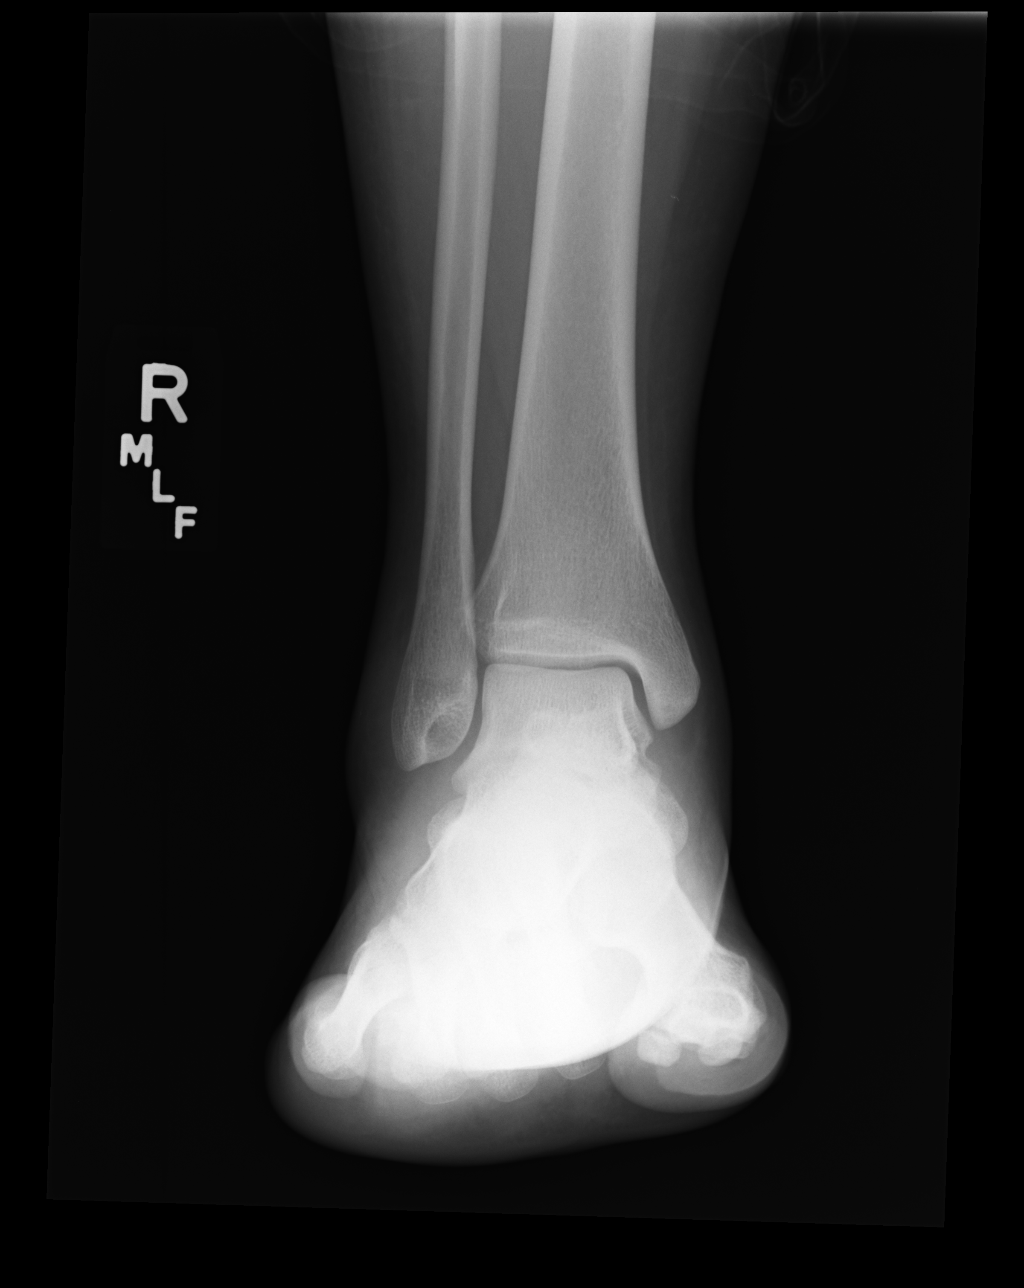

[ap obl int rot]
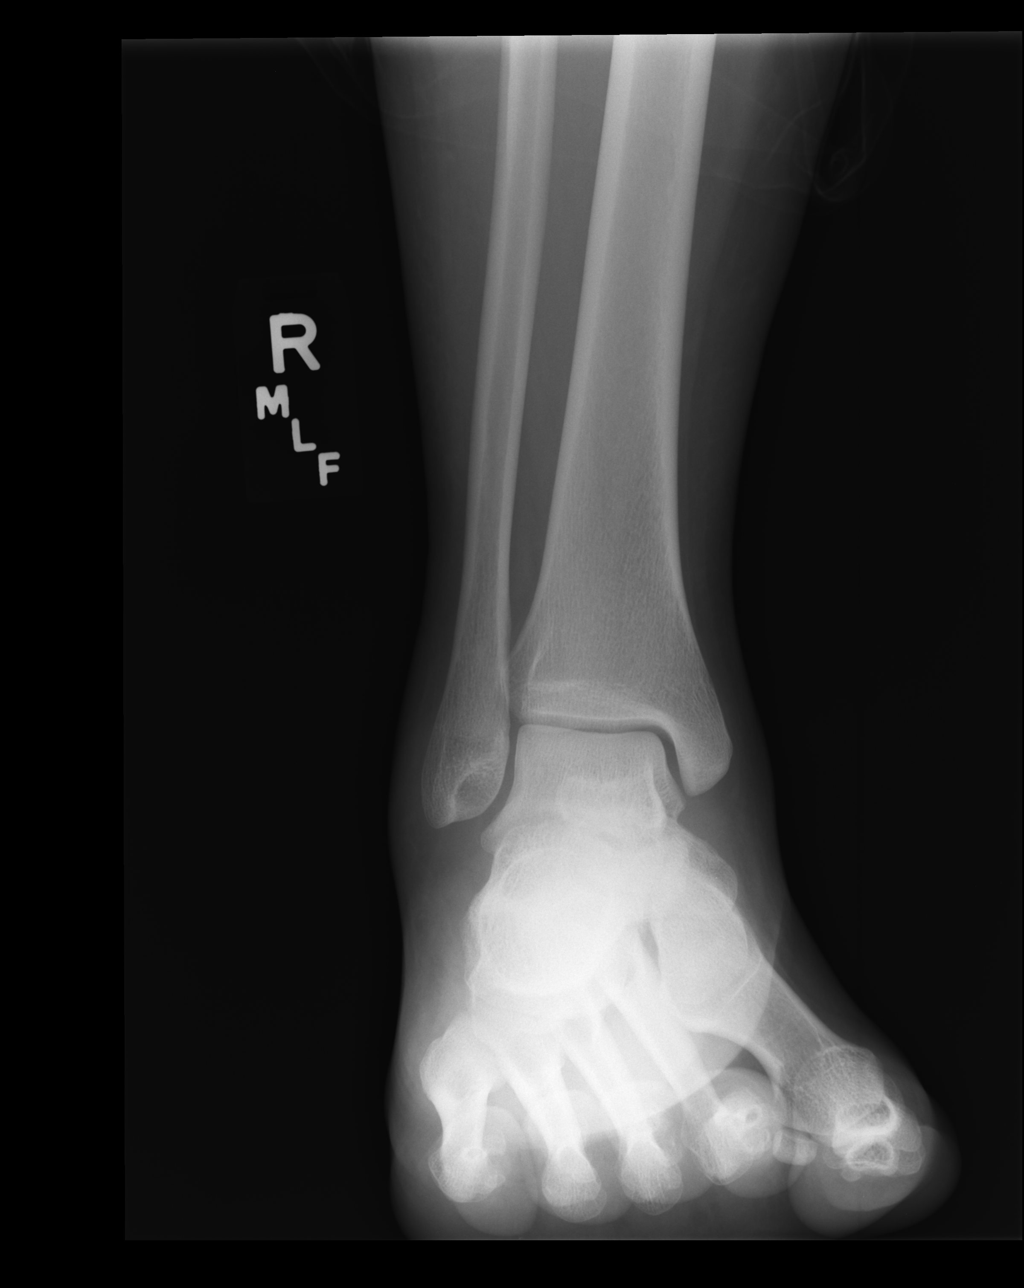

[ap obl ext rot]
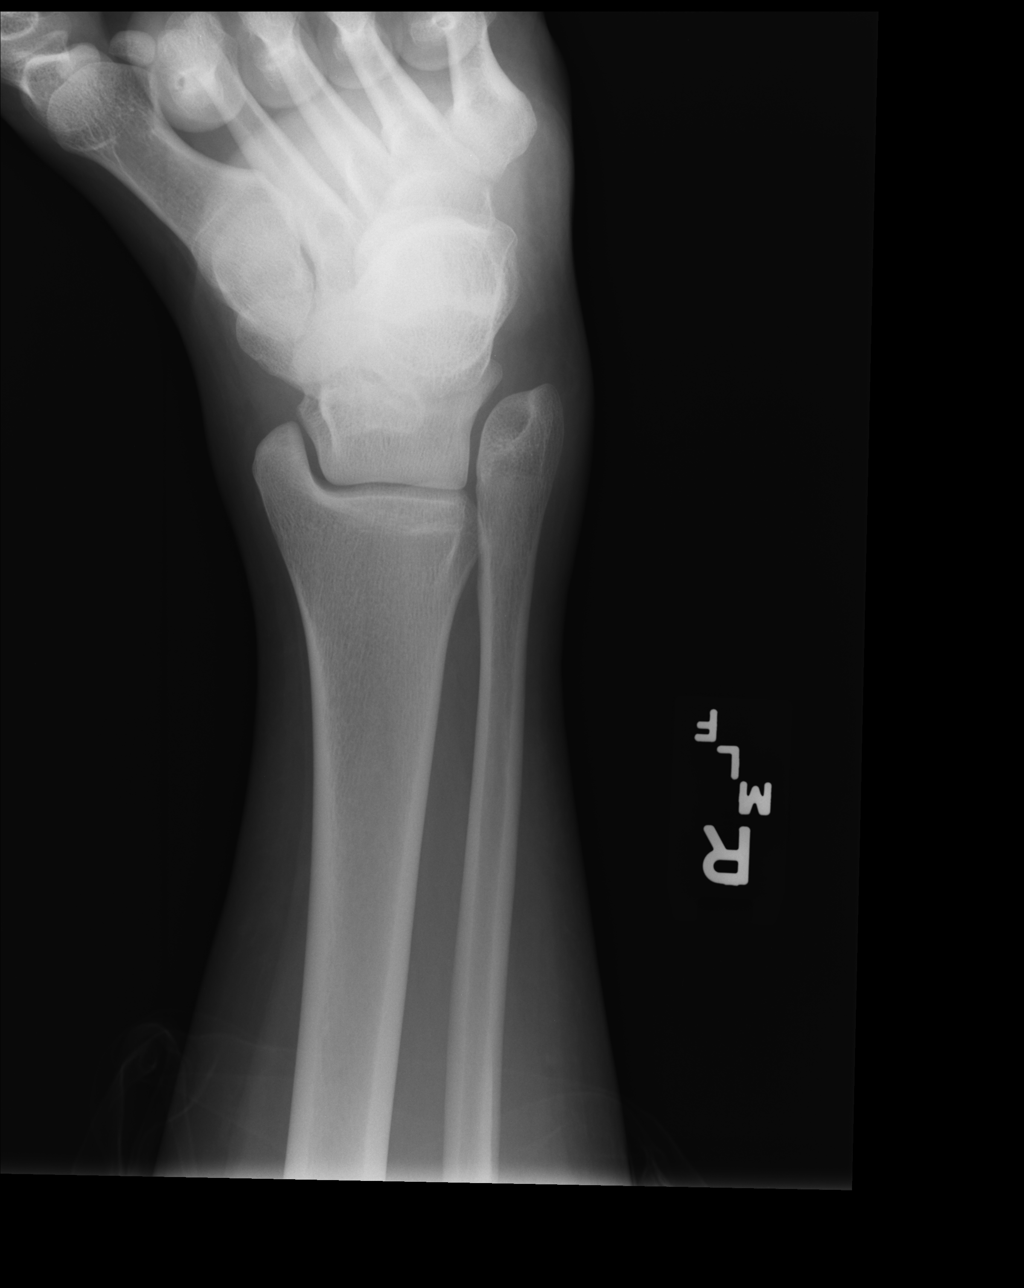

[lateral]
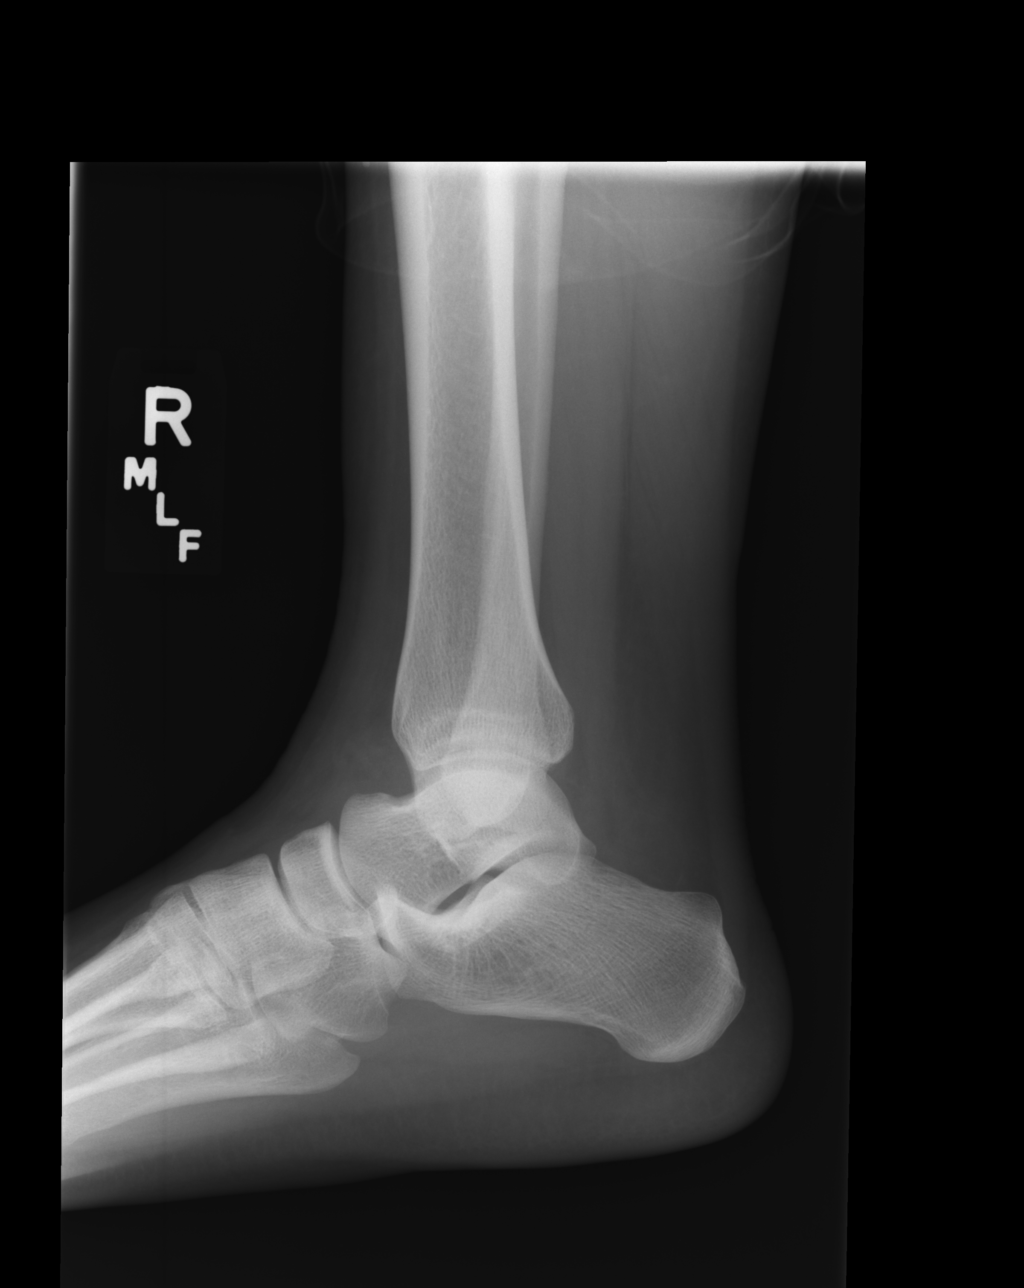

[4 of 4 positions shown; findings below may reference images not displayed]

FINDINGS: No acute bony or joint abnormality is identified. There may be some
soft tissue swelling about the ankle laterally. The appearance is
unchanged.
IMPRESSION: No acute finding. Possible soft tissue swelling about the lateral
ankle is unchanged.

## 2015-08-03 ENCOUNTER — Ambulatory Visit (INDEPENDENT_AMBULATORY_CARE_PROVIDER_SITE_OTHER): Payer: BC Managed Care – PPO | Admitting: Family Medicine

## 2015-08-03 VITALS — BP 145/80 | HR 97 | Temp 99.2°F | Resp 16 | Ht 67.5 in | Wt 216.0 lb

## 2015-08-03 DIAGNOSIS — R509 Fever, unspecified: Secondary | ICD-10-CM

## 2015-08-03 DIAGNOSIS — R059 Cough, unspecified: Secondary | ICD-10-CM

## 2015-08-03 DIAGNOSIS — R05 Cough: Secondary | ICD-10-CM | POA: Diagnosis not present

## 2015-08-03 LAB — POCT INFLUENZA A/B
Influenza A, POC: NEGATIVE
Influenza B, POC: NEGATIVE

## 2015-08-03 MED ORDER — OSELTAMIVIR PHOSPHATE 75 MG PO CAPS: 75.0000 mg | ORAL_CAPSULE | Freq: Two times a day (BID) | ORAL | Status: DC

## 2015-08-03 NOTE — Progress Notes (Signed)
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 She's had a low-grade temperature as well up to 100.  She has no underlying asthma and does not smoke Patient denies any nausea or vomiting, nor diarrhea  Objective:BP 145/80 mmHg  Pulse 97  Temp(Src) 99.2 F (37.3 C) (Oral)  Resp 16  Ht 5' 7.5" (1.715 m)  Wt 216 lb (97.977 kg)  BMI 33.31 kg/m2  SpO2 99%  LMP 07/15/2015 HEENT: Watery nasal discharge, normal TMs, normal oropharynx Neck: Supple without adenopathy Chest: Clear Heart: Regular without murmur  Results for orders placed or performed in visit on 08/03/15  POCT Influenza A/B  Result Value Ref Range   Influenza A, POC Negative Negative   Influenza B, POC Negative Negative   Assessment: Patient has all the features of the flu but the test is negative.  This chart was scribed in my presence and reviewed by me personally.    ICD-9-CM ICD-10-CM   1. Cough 786.2 R05 POCT Influenza A/B     oseltamivir (TAMIFLU) 75 MG capsule  2. Fever, unspecified fever cause 780.60 R50.9 POCT Influenza A/B     oseltamivir (TAMIFLU) 75 MG capsule     Signed, Elvina Sidle, MD

## 2015-08-03 NOTE — Patient Instructions (Signed)
Although the test for flu is negative, I really believe this is what you have.  Stay out of work until Monday and get started on the Tamiflu tonight.

## 2016-01-25 ENCOUNTER — Other Ambulatory Visit: Payer: Self-pay

## 2016-01-25 MED ORDER — VALACYCLOVIR HCL 500 MG PO TABS
500.0000 mg | ORAL_TABLET | Freq: Every day | ORAL | 0 refills | Status: DC
Start: 2016-01-25 — End: 2020-08-09

## 2016-06-27 ENCOUNTER — Other Ambulatory Visit: Payer: Self-pay | Admitting: Physician Assistant

## 2016-09-25 ENCOUNTER — Emergency Department (HOSPITAL_COMMUNITY)
Admission: EM | Admit: 2016-09-25 | Discharge: 2016-09-25 | Disposition: A | Discharge status: Home / Self Care | Payer: BC Managed Care – PPO | Attending: Emergency Medicine | Admitting: Emergency Medicine

## 2016-09-25 ENCOUNTER — Encounter (HOSPITAL_COMMUNITY): Payer: Self-pay | Admitting: Emergency Medicine

## 2016-09-25 DIAGNOSIS — Y92410 Unspecified street and highway as the place of occurrence of the external cause: Secondary | ICD-10-CM | POA: Insufficient documentation

## 2016-09-25 DIAGNOSIS — Y939 Activity, unspecified: Secondary | ICD-10-CM | POA: Insufficient documentation

## 2016-09-25 DIAGNOSIS — Z79899 Other long term (current) drug therapy: Secondary | ICD-10-CM | POA: Insufficient documentation

## 2016-09-25 DIAGNOSIS — M62830 Muscle spasm of back: Secondary | ICD-10-CM | POA: Diagnosis not present

## 2016-09-25 DIAGNOSIS — Y999 Unspecified external cause status: Secondary | ICD-10-CM | POA: Insufficient documentation

## 2016-09-25 DIAGNOSIS — M549 Dorsalgia, unspecified: Secondary | ICD-10-CM | POA: Diagnosis present

## 2016-09-25 DIAGNOSIS — M25512 Pain in left shoulder: Secondary | ICD-10-CM | POA: Insufficient documentation

## 2016-09-25 HISTORY — DX: Headache: R51

## 2016-09-25 HISTORY — DX: Headache, unspecified: R51.9

## 2016-09-25 MED ORDER — METHOCARBAMOL 500 MG PO TABS: 500.0000 mg | ORAL_TABLET | Freq: Two times a day (BID) | ORAL | 0 refills | Status: DC

## 2016-09-25 MED ORDER — NAPROXEN 500 MG PO TABS: 500.0000 mg | ORAL_TABLET | Freq: Two times a day (BID) | ORAL | 0 refills | Status: DC
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
## 2016-09-25 NOTE — Discharge Instructions (Signed)
Take the prescribed medication as directed.  Can use heat therapy as well which will help relax your muscles. Follow-up with a primary care doctor in the area. Return to the ED for new or worsening symptoms.

## 2016-09-25 NOTE — ED Notes (Signed)
Bed: WTR7 Expected date:  Expected time:  Means of arrival:  Comments: 
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
## 2017-07-05 ENCOUNTER — Other Ambulatory Visit: Payer: Self-pay | Admitting: Family Medicine

## 2017-07-05 DIAGNOSIS — E049 Nontoxic goiter, unspecified: Secondary | ICD-10-CM

## 2017-07-12 ENCOUNTER — Ambulatory Visit
Admission: RE | Admit: 2017-07-12 | Discharge: 2017-07-12 | Disposition: A | Discharge status: Home / Self Care | Payer: BC Managed Care – PPO | Source: Ambulatory Visit | Attending: Family Medicine | Admitting: Family Medicine

## 2017-07-12 DIAGNOSIS — E049 Nontoxic goiter, unspecified: Secondary | ICD-10-CM

## 2019-09-17 ENCOUNTER — Other Ambulatory Visit: Payer: Self-pay

## 2019-09-17 ENCOUNTER — Encounter: Payer: Self-pay | Admitting: Physician Assistant

## 2019-09-17 ENCOUNTER — Ambulatory Visit (INDEPENDENT_AMBULATORY_CARE_PROVIDER_SITE_OTHER): Payer: BLUE CROSS/BLUE SHIELD | Admitting: Physician Assistant

## 2019-09-17 DIAGNOSIS — L732 Hidradenitis suppurativa: Secondary | ICD-10-CM

## 2019-09-17 MED ORDER — TRIAMCINOLONE ACETONIDE 10 MG/ML IJ SUSP: 20.0000 mg | Freq: Once | INTRAMUSCULAR | Status: DC

## 2019-09-17 MED ORDER — SULFAMETHOXAZOLE-TRIMETHOPRIM 800-160 MG PO TABS: 1.0000 | ORAL_TABLET | Freq: Two times a day (BID) | ORAL | 0 refills | Status: AC

## 2019-09-22 ENCOUNTER — Telehealth: Payer: Self-pay

## 2019-09-22 NOTE — Telephone Encounter (Signed)
Culture results given to patient and she stated she is much better. Mackey Birchwood is aware

## 2019-09-22 NOTE — Telephone Encounter (Signed)
-----   Message from Glyn Ade, [place] sent at 09/22/2019 12:48 PM EDT ----- Check status please

## 2019-09-23 ENCOUNTER — Telehealth: Payer: Self-pay | Admitting: Physician Assistant

## 2019-09-23 LAB — ANAEROBIC AND AEROBIC CULTURE
MICRO NUMBER:: 10317722
MICRO NUMBER:: 10317723
SPECIMEN QUALITY:: ADEQUATE
SPECIMEN QUALITY:: ADEQUATE

## 2019-09-23 NOTE — Telephone Encounter (Signed)
Patient called to ask is she is supposed to continue Bactrim since culture results did not show bacteria.  Chart # [id number]

## 2019-09-24 NOTE — Telephone Encounter (Signed)
Phone call from patient asking if she needs to continue taking antibiotic since her culture was negative. Informed patient that [facility] PA-C is out of the office and she will be back tomorrow and we will call her back tomorrow with an answer

## 2019-09-25 NOTE — Telephone Encounter (Signed)
PHONE CALL TO PATIENT AND INFORMED HER THAT SHE CAN STOP TAKING THE ANTIBIOTIC PER KELLI SHEFFIELD PA-C

## 2019-10-20 ENCOUNTER — Ambulatory Visit: Payer: Self-pay | Admitting: Physician Assistant

## 2019-10-23 ENCOUNTER — Ambulatory Visit: Payer: Self-pay | Admitting: Physician Assistant

## 2019-12-02 NOTE — Addendum Note (Signed)
Addended by: Mackey Birchwood R on: 12/02/2019 01:06 PM   Modules accepted: Level of Service

## 2019-12-02 NOTE — Progress Notes (Signed)
   New Patient   Subjective  Kari Castro is a 29 y.o. female who presents for the following: Cyst (In her right axilla x3 days. Started draining this morning. Does have minimal soreness.  Does have a history of hidradenitis suppurativa in her armpits when she was in high school.  They used to just come up as dime sized bumps, drain and then resolve.).   The following portions of the chart were reviewed this encounter and updated as appropriate: Tobacco  Allergies  Meds  Problems  Med Hx  Surg Hx  Fam Hx  Soc Hx      Objective  Well appearing patient in no apparent distress; mood and affect are within normal limits.  A focused examination was performed including axillae and groin. Relevant physical exam findings are noted in the Assessment and Plan.  Objective  Right axillae: Large cyst- 3.5x 6cm raised swollen with pustule and green drainage.  Images     Assessment & Plan  Hidradenitis suppurativa Right axillae  Intralesional injection - Right axillae Location:right axillae  Informed Consent: Discussed risks (permanent scarring, light or dark discoloration, infection, pain, bleeding, bruising, redness, damage to adjacent structures, and recurrence of the lesion) and benefits of the procedure, as well as the alternatives.  Informed consent was obtained.  Preparation: The area was prepped with alcohol.  Anesthesia: Lidocaine 2% with epinephrine  Procedure Details: An incision was made overlying the lesion. The lesion drained pus, clear, mucoid fluid, blood and yellow exudate   Antibiotic ointment and a sterile pressure dressing were applied. The patient tolerated procedure well.  Total number of lesions drained: one with tunneling  Plan: The patient was instructed on post-op care. Recommend OTC analgesia as needed for pain.   Anaerobic and Aerobic Culture - Right axillae  sulfamethoxazole-trimethoprim (BACTRIM DS) 800-160 MG tablet - Right  axillae  triamcinolone acetonide (KENALOG) 10 MG/ML injection 20 mg - Right axillae

## 2019-12-31 NOTE — Progress Notes (Deleted)
   Follow-Up Visit   Subjective  Kari Castro is a 29 y.o. female who presents for the following: Cyst (In her right axilla x3 days. Started draining this morning. Does have minimal soreness.  Does have a history of hidradenitis suppurativa in her armpits when she was in high school.  They used to just come up as dime sized bumps, drain and then resolve.).   The following portions of the chart were reviewed this encounter and updated as appropriate: Tobacco  Allergies  Meds  Problems  Med Hx  Surg Hx  Fam Hx  Soc Hx      Objective  Well appearing patient in no apparent distress; mood and affect are within normal limits.  A focused examination was performed including both axillae. Relevant physical exam findings are noted in the Assessment and Plan.  Objective  Right axillae: Large cyst- 3.5x 6cm raised swollen with pustule and green drainage.  Images    Assessment & Plan  Hidradenitis suppurativa Right axillae  Intralesional injection - Right axillae Location:right axillae  Informed Consent: Discussed risks (permanent scarring, light or dark discoloration, infection, pain, bleeding, bruising, redness, damage to adjacent structures, and recurrence of the lesion) and benefits of the procedure, as well as the alternatives.  Informed consent was obtained.  Preparation: The area was prepped with alcohol.  Anesthesia: Lidocaine 2% with epinephrine  Procedure Details: An incision was made overlying the lesion. The lesion drained pus, clear, mucoid fluid, blood and yellow exudate   Antibiotic ointment and a sterile pressure dressing were applied. The patient tolerated procedure well.  Total number of lesions drained: one with tunneling  Plan: The patient was instructed on post-op care. Recommend OTC analgesia as needed for pain.   Anaerobic and Aerobic Culture - Right axillae  sulfamethoxazole-trimethoprim (BACTRIM DS) 800-160 MG tablet - Right  axillae  triamcinolone acetonide (KENALOG) 10 MG/ML injection 20 mg - Right axillae   I, Thunder [place], PA-C, have reviewed all documentation's for this visit.  The documentation on 12/31/19 for the exam, diagnosis, procedures and orders are all accurate and complete.

## 2020-08-09 ENCOUNTER — Other Ambulatory Visit: Payer: Self-pay | Admitting: *Deleted

## 2020-08-09 MED ORDER — VALACYCLOVIR HCL 500 MG PO TABS: 500.0000 mg | ORAL_TABLET | Freq: Every day | ORAL | 1 refills | Status: DC

## 2020-08-09 NOTE — Telephone Encounter (Signed)
Phone call from patient needing a refill on her Valacyclovir.  Patient was informed that she needs an appointment in April and we can send in a refill on the Valacyclovir until then.  Patient aware, prescription sent to patient's Pharmacy and patient made an appointment.

## 2020-11-02 ENCOUNTER — Ambulatory Visit (INDEPENDENT_AMBULATORY_CARE_PROVIDER_SITE_OTHER): Payer: 59 | Admitting: Physician Assistant

## 2020-11-02 ENCOUNTER — Other Ambulatory Visit: Payer: Self-pay

## 2020-11-02 ENCOUNTER — Encounter: Payer: Self-pay | Admitting: Physician Assistant

## 2020-11-02 DIAGNOSIS — L732 Hidradenitis suppurativa: Secondary | ICD-10-CM | POA: Diagnosis not present

## 2020-11-02 DIAGNOSIS — B001 Herpesviral vesicular dermatitis: Secondary | ICD-10-CM

## 2020-11-02 MED ORDER — VALACYCLOVIR HCL 500 MG PO TABS: 500.0000 mg | ORAL_TABLET | Freq: Every day | ORAL | 11 refills | Status: DC

## 2020-11-02 NOTE — Progress Notes (Signed)
   Follow-Up Visit   Subjective  Kari Castro is a 30 y.o. female who presents for the following: Medication Refill (Patient here today for yearly HS follow up. No current flares. In 2021 she had a negative culture without evidence of MRSA.  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 She has been getting a lot of them due to stress. They are painful and she is self conscious about them. Patient would also like to discuss a skin care regimen.).   The following portions of the chart were reviewed this encounter and updated as appropriate:      Objective  Well appearing patient in no apparent distress; mood and affect are within normal limits.  All skin waist up and suprapubic area examined.  Objective  Lips: No blisters today.  Objective  Right Axilla, both axillae and suprapubic: Scarring and tunneling in both axillae. 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  Assessment & Plan  Fever blister Lips  valACYclovir (VALTREX) 500 MG tablet - Lips  Hidradenitis suppurativa (2) both axillae and suprapubic; Right Axilla  She is in remission for now. She will call if she gets a flare or needs a cyst drained.   I, Annistyn Depass, PA-C, have reviewed all documentation's for this visit.  The documentation on 11/02/20 for the exam, diagnosis, procedures and orders are all accurate and complete.

## 2021-11-08 ENCOUNTER — Ambulatory Visit: Payer: 59 | Admitting: Physician Assistant

## 2021-11-15 ENCOUNTER — Encounter: Payer: Self-pay | Admitting: Physician Assistant

## 2021-11-15 ENCOUNTER — Ambulatory Visit (INDEPENDENT_AMBULATORY_CARE_PROVIDER_SITE_OTHER): Payer: Self-pay | Admitting: Physician Assistant

## 2021-11-15 DIAGNOSIS — L700 Acne vulgaris: Secondary | ICD-10-CM

## 2021-11-15 DIAGNOSIS — L732 Hidradenitis suppurativa: Secondary | ICD-10-CM

## 2021-11-15 DIAGNOSIS — B001 Herpesviral vesicular dermatitis: Secondary | ICD-10-CM

## 2021-11-15 MED ORDER — VALACYCLOVIR HCL 500 MG PO TABS: 500.0000 mg | ORAL_TABLET | Freq: Every day | ORAL | 11 refills | Status: AC

## 2021-11-15 MED ORDER — CLINDAMYCIN PHOSPHATE 1 % EX GEL: CUTANEOUS | 2 refills | Status: DC

## 2021-11-15 MED ORDER — TRETINOIN 0.025 % EX CREA: TOPICAL_CREAM | Freq: Every day | CUTANEOUS | 2 refills | Status: AC

## 2021-11-15 NOTE — Progress Notes (Signed)
   Follow-Up Visit   Subjective  Kari Castro is a 31 y.o. female who presents for the following: Annual Exam (Acne face and back new this year otc topical wash and benzoyl peroxidecream with + pigment change. Face is getting tiny, hard bumps.  Flaring. Hidradenitis doing well but recently had a cyst on both axillae. Under control currently,refill bactrim for one year. Refill valacyclovir for fever blisters.     The following portions of the chart were reviewed this encounter and updated as appropriate:  Tobacco  Allergies  Meds  Problems  Med Hx  Surg Hx  Fam Hx      Objective  Well appearing patient in no apparent distress; mood and affect are within normal limits.  All skin waist up examined.  Mid [place] Lip Clear today  both axillae Erythematous nodule  Head - Anterior (Face) Papular erythema with pustules and comeodomes.    Assessment & Plan  Fever blister Mid [place] Lip  valACYclovir (VALTREX) 500 MG tablet - Mid [place] Lip Take 1 tablet (500 mg total) by mouth daily.  Hidradenitis suppurativa both axillae  Continue bactrim  Acne vulgaris Head - Anterior (Face)  tretinoin (RETIN-A) 0.025 % cream - Head - Anterior (Face) Apply topically at bedtime. Apply topical qhs for acne  clindamycin (CLINDAGEL) 1 % gel - Head - Anterior (Face) Apply topical daily to acne    I, Cinque Begley, PA-C, have reviewed all documentation's for this visit.  The documentation on 11/15/21 for the exam, diagnosis, procedures and orders are all accurate and complete.

## 2021-11-20 ENCOUNTER — Telehealth: Payer: Self-pay | Admitting: Physician Assistant

## 2021-11-20 DIAGNOSIS — L700 Acne vulgaris: Secondary | ICD-10-CM

## 2021-11-20 MED ORDER — CLINDAMYCIN PHOSPHATE 1 % EX GEL: CUTANEOUS | 2 refills | Status: AC

## 2021-11-20 NOTE — Telephone Encounter (Signed)
Patient is calling to say that the pharmacy could no longer fill the  clindamycin clindamycin (CLINDAGEL) 1 % gel.  The pharmacy is [facility] [id number] - [place], [place] - [address] (Ph: [phone]).  Patient would like too switch pharmacies to  [address] [place], [place] [address] Phone: [phone] Fax: [phone]

## 2021-11-20 NOTE — Telephone Encounter (Unsigned)
Patient wanted prescription

## 2021-12-04 NOTE — ED Provider Notes (Signed)
Formatting of this note is different from the original.  Images from the original note were not included.                            [facility] Emergency Department   Date of Service:  12/04/2021  Chief Complaint   Patient presents with    Ankle Pain      Pt to ed pov ambulatory c/o left ankle pain , denied injury   Reported hx of sprain left ankle 1 year ago       31 year old female presents with complaint of left ankle pain, onset 2 days ago.  Denies any associated injury recently, states she had some sprained same ankle 1 year ago.  Reports pain to the lateral aspect with associated swelling, no medications taken prior to arrival.      No past medical history on file.  No past surgical history on file.  Social History     Socioeconomic History    Marital status: Single     Spouse name: Not on file    Number of children: Not on file    Years of education: Not on file    Highest education level: Not on file   Occupational History    Not on file   Tobacco Use    Smoking status: Not on file    Smokeless tobacco: Not on file   Substance and Sexual Activity    Alcohol use: Not on file    Drug use: Not on file    Sexual activity: Not on file     No family history on file.    Review of Systems   Constitutional:  Negative for activity change.   Musculoskeletal:  Positive for joint swelling. Negative for gait problem.   Skin:  Negative for color change.   All other systems reviewed and are negative.     ED Triage Vitals [12/04/21 1522]   Enc Vitals Group      BP 132/89      Pulse 81      Resp 18      Temp 37.6 C (99.7 F)      Temp src       SpO2 100 %      Weight 99.8 kg (220 lb)      Height       Head Circumference       Peak Flow       Pain Score       Pain Loc       Pain Edu?       Excl. in GC?      I performed a physical exam  Patient height not recorded  Physical Exam  Vitals and nursing note reviewed.   Constitutional:       General: She is not in acute distress.     Appearance: She is well-developed.    HENT:      Head: Normocephalic and atraumatic.      Right Ear: External ear normal.      Left Ear: External ear normal.   Eyes:      Conjunctiva/sclera: Conjunctivae normal.      Pupils: Pupils are equal, round, and reactive to light.   Cardiovascular:      Rate and Rhythm: Normal rate and regular rhythm.      Pulses: Normal pulses.      Heart sounds: Normal heart sounds. No murmur heard.  No friction rub. No gallop.   Pulmonary:      Effort: Pulmonary effort is normal. No respiratory distress.      Breath sounds: Normal breath sounds.   Musculoskeletal:         General: Tenderness present. No swelling, deformity or signs of injury. Normal range of motion.      Cervical back: Normal range of motion and neck supple.        Legs:       Comments: Localized point tenderness of left lateral malleolus.  No obvious swelling or deformity.   Skin:     General: Skin is warm and dry.      Findings: No bruising.   Neurological:      General: No focal deficit present.      Mental Status: She is alert and oriented to person, place, and time.   Psychiatric:         Behavior: Behavior normal. Behavior is cooperative.     Results:   Results for orders placed or performed during the hospital encounter of 12/04/21 (from the past 24 hour(s))   XRAY ANKLE 3+ VIEWS LEFT    Narrative    Left ankle RADIOGRAPHS, 3 VIEWS    History: Left ankle pain. Sprained one year ago. No recent trauma.    Comparison: None.     Impression    Impression:    No fracture or malalignment.    Reading Doctor: Sallye Ober  Electronic Signature by: Sallye Ober     Medications Ordered/Adminstered During This ED Visit:   Medications - No data to display    EKG (Interpretation if ordered):         ASSESSMENT / MEDICAL DECISION MAKING  I have reviewed the past medical, family, social history sections: including the medications, nursing notes, vital signs, and allergies. Independent review and interpretation of results as below in MDM/ED Course.       Medical Decision Making  Patient presents with complaint of left ankle pain, onset 2 days ago.  No associated injury.  ED course: Left ankle x-ray reported by radiology negative for acute injury.  Findings reviewed with patient, Ace wrap applied, instructed orthopedic follow-up next for 5 days, will discharge home with NSAID.  Return instructions given.         ED COURSE      Clinical Impressions as of 12/04/21 1628   Acute left ankle pain         ED Provider: Trudi Ida, PA-C      Electronically signed by Zenia Resides, MD at 12/05/2021 11:45 PM EDT

## 2021-12-04 NOTE — Unmapped (Signed)
Formatting of this note is different from the original.  Images from the original note were not included.      [id number] en   Elastic Bandage Wrap   Minor muscle or joint injuries are often treated with an elastic bandage. The bandage provides support and compression to the injured area. An elastic bandage is a stretchy, rolled bandage. Elastic bandages range in width from 2 to 6 inches. They can be used for a variety of injuries. The bandages are often called ACE bandages, after a common brand name.   If used correctly, elastic bandages help control swelling and ease pain. An elastic bandage is also a good reminder not to overuse the injured area. However, elastic bandages do not provide a lot of support and will not prevent re-injury.   Home care     To apply an elastic bandage:     Check the skin before wrapping the injury. It should be clean, dry, and free of drainage.     Start wrapping below the injury and work your way toward the body. For an ankle sprain, start wrapping around the foot and work up toward the calf. This will help control swelling.     Overlap the edges of the bandage so it stays snugly in place.     Wrap the bandage firmly, but not too tightly. A tight bandage can increase swelling on either end of the bandage. Make sure the bandage is wrinkle free.     Leave fingers and toes exposed.     Secure ends of the bandage (even self-sticking ones) with clips or tape.     Check often to be sure there is good circulation, especially in the fingers and toes. Loosen the bandage if there is local swelling, numbness, tingling, discomfort, coldness, or discoloration (skin pale or bluish in color).     Re-wrap the bandage as needed during the day. Re-roll the bandage as you unwind it.   Continue using the elastic bandage until the pain and swelling are gone or as your healthcare provider advises.   If you've been told to ice the area, the ice can be secured in place with the elastic bandage. To make an  ice pack, put ice cubes into a plastic bag that seals at the top. Wrap the ice pack with a thin towel to protect the skin. Don't put ice or an ice pack directly on the skin. Ice the area for no more than 20 minutes at a time.       Follow-up care   Follow up with your healthcare provider, or as advised.     When to get medical advice   Call your healthcare provider for any of the following:     Pain and swelling that doesn't get better or that gets worse     Trouble moving injured area     Skin discoloration, numbness, or tingling that doesn?t go away after bandage is removed     Last Reviewed Date: 07/19/2020    2000-2023 [employer], [place]. All rights reserved. This information is not intended as a substitute for professional medical care. Always follow your healthcare professional's instructions.       Electronically signed by Trudi Ida, PA-C at 12/04/2021  4:26 PM EDT

## 2022-07-14 ENCOUNTER — Inpatient Hospital Stay
Admit: 2022-07-14 | Discharge: 2022-07-14 | Disposition: A | Discharge status: Home / Self Care | Attending: Emergency Medicine

## 2022-07-14 DIAGNOSIS — J101 Influenza due to other identified influenza virus with other respiratory manifestations: Secondary | ICD-10-CM

## 2022-07-14 LAB — COVID-19 & INFLUENZA COMBO
Rapid Influenza A By PCR: NOT DETECTED
Rapid Influenza B By PCR: DETECTED — AB
SARS-CoV-2, PCR: NOT DETECTED

## 2022-07-14 MED ORDER — OSELTAMIVIR PHOSPHATE 75 MG PO CAPS
75 MG | ORAL_CAPSULE | Freq: Two times a day (BID) | ORAL | 0 refills | Status: AC
Start: 2022-07-14 — End: 2022-07-19

## 2022-07-14 MED ORDER — ACETAMINOPHEN 325 MG PO TABS
325 MG | ORAL | Status: DC
Start: 2022-07-14 — End: 2022-07-14

## 2022-07-14 MED ORDER — ACETAMINOPHEN 500 MG PO TABS
500 MG | ORAL_TABLET | Freq: Four times a day (QID) | ORAL | 1 refills | Status: AC | PRN
Start: 2022-07-14 — End: ?

## 2022-07-14 MED ORDER — KETOROLAC TROMETHAMINE 10 MG PO TABS
10 MG | Freq: Once | ORAL | Status: AC
Start: 2022-07-14 — End: 2022-07-14
  Administered 2022-07-14: 21:00:00 10 mg via ORAL

## 2022-07-14 MED FILL — KETOROLAC TROMETHAMINE 10 MG PO TABS: 10 MG | ORAL | Qty: 1

## 2022-07-14 NOTE — ED Provider Notes (Signed)
[facility] EMERGENCY DEPT  EMERGENCY DEPARTMENT HISTORY AND PHYSICAL EXAM      Date: 07/14/2022  Patient Name: Teresa Ross  MRN: [id number]  Birthdate: 11-14-1990  Date of evaluation: 07/14/2022  Provider: Domingo Dimes, MD   Note Started: 3:29 PM EST 07/14/22    HISTORY OF PRESENT ILLNESS   No chief complaint on file.      History Provided By: Patient    HPI: Teresa Ross is a 32 y.o. female     PAST MEDICAL HISTORY   Past Medical History:  No past medical history on file.    Past Surgical History:  No past surgical history on file.    Family History:  No family history on file.    Social History:       Allergies:  Not on File    PCP: No primary care provider on file.    Current Meds:   No current facility-administered medications for this encounter.     No current outpatient medications on file.       Social Determinants of Health:   Social Determinants of Health     Tobacco Use: Not on file   Alcohol Use: Not on file   Financial Resource Strain: Not on file   Food Insecurity: Not on file   Transportation Needs: Not on file   Physical Activity: Not on file   Stress: Not on file   Social Connections: Not on file   Intimate Partner Violence: Not on file   Depression: Not on file   Housing Stability: Not on file   Interpersonal Safety: Not on file   Utilities: Not on file       PHYSICAL EXAM   Physical Exam  Vitals and nursing note reviewed.   Constitutional:       General: She is not in acute distress.     Appearance: Normal appearance. She is obese. She is not ill-appearing, toxic-appearing or diaphoretic.   HENT:      Head: Normocephalic and atraumatic.      Nose: Nose normal.      Mouth/Throat:      Mouth: Mucous membranes are moist.      Pharynx: No oropharyngeal exudate.   Eyes:      Extraocular Movements: Extraocular movements intact.      Conjunctiva/sclera: Conjunctivae normal.      Pupils: Pupils are equal, round, and reactive to light.   Cardiovascular:      Rate and Rhythm: Normal rate and regular  rhythm.      Pulses: Normal pulses.      Heart sounds: Normal heart sounds.   Pulmonary:      Effort: Pulmonary effort is normal.      Breath sounds: Normal breath sounds.   Abdominal:      General: Bowel sounds are normal.      Palpations: Abdomen is soft.      Tenderness: There is no abdominal tenderness.   Musculoskeletal:         General: Tenderness present. No deformity or signs of injury. Normal range of motion.      Cervical back: Normal range of motion and neck supple. No bony tenderness.      Thoracic back: No bony tenderness.      Lumbar back: Tenderness present. No spasms or bony tenderness.   Skin:     General: Skin is warm and dry.      Capillary Refill: Capillary refill takes less than 2 seconds.   Neurological:  General: No focal deficit present.      Mental Status: She is alert and oriented to person, place, and time.   Psychiatric:         Mood and Affect: Mood normal.         Behavior: Behavior normal.           SCREENINGS              LAB, EKG AND DIAGNOSTIC RESULTS   Labs:  No results found for this or any previous visit (from the past 12 hour(s)).    EKG: Not Applicable    Radiologic Studies:  Non-plain film images such as CT, Ultrasound and MRI are read by the radiologist. Plain radiographic images are visualized and preliminarily interpreted by the ED Physician with the following findings: Not Applicable.    Interpretation per the Radiologist below, if available at the time of this note:  No orders to display        ED COURSE and DIFFERENTIAL DIAGNOSIS/MDM   CC/HPI Summary, DDx, ED Course, and Reassessment: 32 year old female presents with complaint of lower back pain started 3 days ago patient denies any falls denies any heavy lifting or any sudden movements, patient states that 2 days ago she had a fever with Tmax of 101, patient complaining of cough and worsening pain over the last 2 days    Records Reviewed (source and summary of external notes): Prior medical records and Nursing  notes    Vitals:  There were no vitals filed for this visit.     ED COURSE  COVID-19/influenza  Toradol p.o.  ED Course as of 07/14/22 1645   Sat Jul 14, 2022   1642 Rapid Influenza B By PCR(!): DETECTED [SB]      ED Course User Index  [SB] Benn-Thompson, Cherry Wittwer, MD       Disposition Considerations (Tests not done, Shared Decision Making, Pt Expectation of Test or Treatment.):  Patient informed she is positive for influenza B, patient provided with prescription for Tamiflu and other meds for symptomatic management    Patient was given the following medications:  Medications - No data to display    CONSULTS: (Who and What was discussed)  None     Social Determinants affecting Dx or Tx: None    Smoking Cessation: Not Applicable    PROCEDURES   Unless otherwise noted above, none.  Procedures      CRITICAL CARE TIME   Patient does not meet Critical Care Time, 0 minutes    FINAL IMPRESSION   No diagnosis found.      DISPOSITION/PLAN   DISPOSITION      Discharge Note: The patient is stable for discharge home. The signs, symptoms, diagnosis, and discharge instructions have been discussed, understanding conveyed, and agreed upon. The patient is to follow up as recommended or return to ER should their symptoms worsen.      PATIENT REFERRED TO:  No follow-up provider specified.      DISCHARGE MEDICATIONS:     Medication List      You have not been prescribed any medications.           DISCONTINUED MEDICATIONS:  There are no discharge medications for this patient.      I am the Primary Clinician of Record: Doylene Bode, MD (electronically signed)    (Please note that parts of this dictation were completed with voice recognition software. Quite often unanticipated grammatical, syntax, homophones, and other interpretive errors are inadvertently transcribed by  the computer software. Please disregards these errors. Please excuse any errors that have escaped final proofreading.)     Doylene Bode, MD  07/14/22  1645

## 2022-07-14 NOTE — ED Triage Notes (Signed)
Pt reports back pain that began last week with worsening on 07/10/2022. Pt rates back pain at 7/10.
